# Patient Record
Sex: Male | Born: 1970 | Race: Black or African American | Hispanic: No | Marital: Married | Smoking: Current every day smoker
Health system: Southern US, Community
[De-identification: ages and names within clinical notes are randomized; demographics above are authoritative.]

---

## 2010-02-04 ENCOUNTER — Emergency Department (HOSPITAL_COMMUNITY): Admission: EM | Admit: 2010-02-04 | Discharge: 2010-02-05 | Payer: Self-pay | Admitting: Emergency Medicine

## 2010-12-26 LAB — CBC
Platelets: 216 10*3/uL (ref 150–400)
RDW: 14.1 % (ref 11.5–15.5)
WBC: 9 10*3/uL (ref 4.0–10.5)

## 2010-12-26 LAB — COMPREHENSIVE METABOLIC PANEL
AST: 25 U/L (ref 0–37)
BUN: 10 mg/dL (ref 6–23)
CO2: 31 mEq/L (ref 19–32)
Calcium: 10 mg/dL (ref 8.4–10.5)
Chloride: 104 mEq/L (ref 96–112)
Creatinine, Ser: 0.83 mg/dL (ref 0.4–1.5)
GFR calc non Af Amer: >60 mL/min (ref >60)
Glucose, Bld: 97 mg/dL (ref 70–99)
Potassium: 4.1 mEq/L (ref 3.5–5.1)
Total Bilirubin: 1.4 mg/dL — ABNORMAL HIGH (ref 0.3–1.2)
Total Protein: 8.2 g/dL (ref 6.0–8.3)

## 2010-12-26 LAB — TYPE AND SCREEN: Antibody Screen: NEGATIVE

## 2010-12-26 LAB — HEMOCCULT GUIAC POC 1CARD (OFFICE): Fecal Occult Bld: POSITIVE

## 2010-12-26 LAB — DIFFERENTIAL
Basophils Absolute: 0 10*3/uL (ref 0.0–0.1)
Lymphocytes Relative: 9 % — ABNORMAL LOW (ref 12–46)
Monocytes Absolute: 0.4 10*3/uL (ref 0.1–1.0)
Neutro Abs: 7.8 10*3/uL — ABNORMAL HIGH (ref 1.7–7.7)

## 2010-12-26 LAB — PROTIME-INR: Prothrombin Time: 13.3 seconds (ref 11.6–15.2)

## 2019-02-09 ENCOUNTER — Emergency Department (HOSPITAL_BASED_OUTPATIENT_CLINIC_OR_DEPARTMENT_OTHER): Payer: BLUE CROSS/BLUE SHIELD

## 2019-02-09 ENCOUNTER — Encounter (HOSPITAL_BASED_OUTPATIENT_CLINIC_OR_DEPARTMENT_OTHER): Payer: Self-pay

## 2019-02-09 ENCOUNTER — Other Ambulatory Visit: Payer: Self-pay

## 2019-02-09 ENCOUNTER — Emergency Department (HOSPITAL_BASED_OUTPATIENT_CLINIC_OR_DEPARTMENT_OTHER)
Admission: EM | Admit: 2019-02-09 | Discharge: 2019-02-09 | Disposition: A | Discharge status: Home / Self Care | Payer: BLUE CROSS/BLUE SHIELD | Attending: Emergency Medicine | Admitting: Emergency Medicine

## 2019-02-09 DIAGNOSIS — Y929 Unspecified place or not applicable: Secondary | ICD-10-CM | POA: Insufficient documentation

## 2019-02-09 DIAGNOSIS — S72124A Nondisplaced fracture of lesser trochanter of right femur, initial encounter for closed fracture: Secondary | ICD-10-CM | POA: Diagnosis not present

## 2019-02-09 DIAGNOSIS — Y999 Unspecified external cause status: Secondary | ICD-10-CM | POA: Diagnosis not present

## 2019-02-09 DIAGNOSIS — F17210 Nicotine dependence, cigarettes, uncomplicated: Secondary | ICD-10-CM | POA: Diagnosis not present

## 2019-02-09 DIAGNOSIS — X501XXA Overexertion from prolonged static or awkward postures, initial encounter: Secondary | ICD-10-CM | POA: Insufficient documentation

## 2019-02-09 DIAGNOSIS — Y9366 Activity, soccer: Secondary | ICD-10-CM | POA: Insufficient documentation

## 2019-02-09 DIAGNOSIS — S72121A Displaced fracture of lesser trochanter of right femur, initial encounter for closed fracture: Secondary | ICD-10-CM

## 2019-02-09 DIAGNOSIS — S8991XA Unspecified injury of right lower leg, initial encounter: Secondary | ICD-10-CM | POA: Diagnosis present

## 2019-02-09 MED ORDER — ACETAMINOPHEN 500 MG PO TABS
1000.0000 mg | ORAL_TABLET | Freq: Once | ORAL | Status: AC
  Administered 2019-02-09: 08:00:00 1000 mg via ORAL
  Filled 2019-02-09: qty 2

## 2019-02-09 MED ORDER — HYDROCODONE-ACETAMINOPHEN 5-325 MG PO TABS: 1.0000 | ORAL_TABLET | ORAL | 0 refills | Status: AC | PRN

## 2019-02-09 NOTE — ED Provider Notes (Signed)
[facility] EMERGENCY DEPARTMENT Provider Note   CSN: [id number] Arrival date & time: 02/09/19  0710    History   Chief Complaint Chief Complaint  Patient presents with  . Leg Injury    HPI Anthony FreestoneChristopher Archambault is a 48 y.o. male.     48yo M who p/w R hip pain. Yesterday around 6pm, he was playing soccer and accidentally stepped on the ball instead of kicking it, causing him to twist his R leg at the hip and then fall onto his right hip/buttock. He has had progressively worsening pain in his hip and thigh since then, worse w/ ambulation. He took ibuprofen around 5am without relief. No pain at knee or ankle. No previous injury to joint.   The history is provided by the patient.    History reviewed. No pertinent past medical history.  There are no active problems to display for this patient.   History reviewed. No pertinent surgical history.      Home Medications    Prior to Admission medications   Medication Sig Start Date End Date Taking? Authorizing Provider  HYDROcodone-acetaminophen (NORCO/VICODIN) 5-325 MG tablet Take 1 tablet by mouth every 4 (four) hours as needed for up to 5 days for severe pain. 02/09/19 02/14/19  Jasmin Trumbull, Ambrose Finlandachel Morgan, MD    Family History History reviewed. No pertinent family history.  Social History Social History   Tobacco Use  . Smoking status: Current Every Day Smoker    Types: Cigarettes  . Smokeless tobacco: Never Used  Substance Use Topics  . Alcohol use: Yes    Alcohol/week: 2.0 standard drinks    Types: 2 Cans of beer per week  . Drug use: Not Currently     Allergies   Patient has no known allergies.   Review of Systems Review of Systems  Constitutional: Negative for fever.  Musculoskeletal: Positive for arthralgias and gait problem.  Skin: Negative for wound.  Neurological: Negative for weakness and numbness.     Physical Exam Updated Vital Signs BP 133/82 (BP Location: Right Arm)   Pulse 88   Temp  98.4 F (36.9 C) (Oral)   Resp 16   Ht 5\' 7"  (1.702 m)   Wt 77.1 kg   SpO2 100%   BMI 26.63 kg/m   Physical Exam Vitals signs and nursing note reviewed.  Constitutional:      General: He is not in acute distress.    Appearance: He is well-developed.  HENT:     Head: Normocephalic and atraumatic.  Eyes:     Conjunctiva/sclera: Conjunctivae normal.  Neck:     Musculoskeletal: Neck supple.  Cardiovascular:     Pulses: Normal pulses.  Musculoskeletal: Normal range of motion.        General: No deformity.     Comments: Normal ROM and no tenderness at R ankle and knee; mild tenderness along R lateral quadriceps; full ROM R hip with pain during internal rotation  Skin:    General: Skin is warm and dry.  Neurological:     Mental Status: He is alert and oriented to person, place, and time.     Sensory: No sensory deficit.     Motor: No weakness.     Comments: Normal strength RLE  Psychiatric:        Judgment: Judgment normal.      ED Treatments / Results  Labs (all labs ordered are listed, but only abnormal results are displayed) Labs Reviewed - No data to display  EKG None  Radiology Dg Hip Unilat With Pelvis 2-3 Views Right  Result Date: 02/09/2019 CLINICAL DATA:  Soccer injury with acute right hip and thigh pain. Initial encounter. EXAM: DG HIP (WITH OR WITHOUT PELVIS) 2-3V RIGHT COMPARISON:  None. FINDINGS: There is a minimally displaced avulsion off of the tip of the lesser trochanter. No other evidence of fracture or dislocation. Mild proliferative disease involving both acetabula. The bony pelvis demonstrates no evidence of fracture, diastasis or lesion. IMPRESSION: Minimally displaced avulsive injury of the right lesser trochanter. Electronically Signed   By: Irish Lack M.D.   On: 02/09/2019 08:08   Dg Femur Min 2 Views Right  Result Date: 02/09/2019 CLINICAL DATA:  Soccer injury with acute right hip and thigh pain. Initial encounter. EXAM: RIGHT FEMUR 2 VIEWS  COMPARISON:  Right hip films on the same day. FINDINGS: As noted on the right hip films, there is an avulsive injury of the tip of the lesser trochanter which appears partial and minimally displaced. IMPRESSION: Partial avulsive injury off of the tip of the lesser trochanter. Electronically Signed   By: Irish Lack M.D.   On: 02/09/2019 08:12    Procedures Procedures (including critical care time)  Medications Ordered in ED Medications  acetaminophen (TYLENOL) tablet 1,000 mg (1,000 mg Oral Given 02/09/19 0733)     Initial Impression / Assessment and Plan / ED Course  I have reviewed the triage vital signs and the nursing notes.  Pertinent  imaging results that were available during my care of the patient were reviewed by me and considered in my medical decision making (see chart for details).        Neurovascularly intact distally.  Plain films show minimally displaced avulsion fracture of right lesser trochanter of femur.  Discussed with orthopedics, Dr. Lequita Halt, who recommends crutches, weight bearing as tolerated, and f/u in clinic in 1 week or sooner if worsening pain.  Reviewed supportive measures.  Pt voiced understanding of plan.  Final Clinical Impressions(s) / ED Diagnoses   Final diagnoses:  Closed avulsion fracture of lesser trochanter of femur, right, initial encounter [facility])    ED Discharge Orders         Ordered    HYDROcodone-acetaminophen (NORCO/VICODIN) 5-325 MG tablet  Every 4 hours PRN     02/09/19 0850           Nalla Purdy, Ambrose Finland, MD 02/09/19 [phone]

## 2019-02-09 NOTE — ED Triage Notes (Signed)
Pt playing soccer yesterday, 6pm went to kick the ball and right foot landed awkwardly on the ball, pt fell, causing pain right hip and thigh.  Taking ibuprofen with little improvement.  Last taken at 0500.

## 2020-09-10 IMAGING — DX RIGHT FEMUR 2 VIEWS
4 series · 4 of 4 positions shown · non-contrast
Comparison: Right hip films on the same day.

CLINICAL DATA: Soccer injury with acute right hip and thigh pain.
Initial encounter.

EXAM:
RIGHT FEMUR 2 VIEWS

[femur ap (1 of 2)]
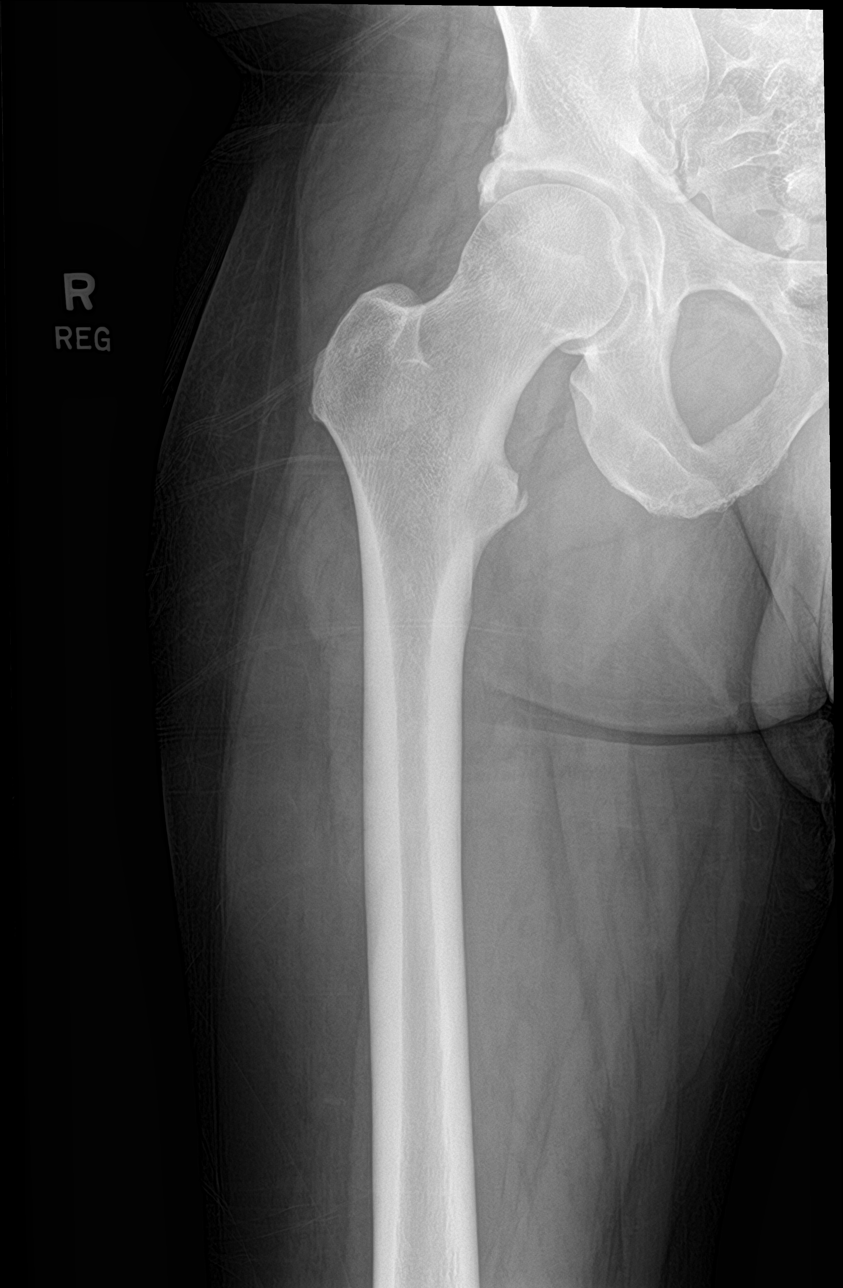

[femur ap (2 of 2)]
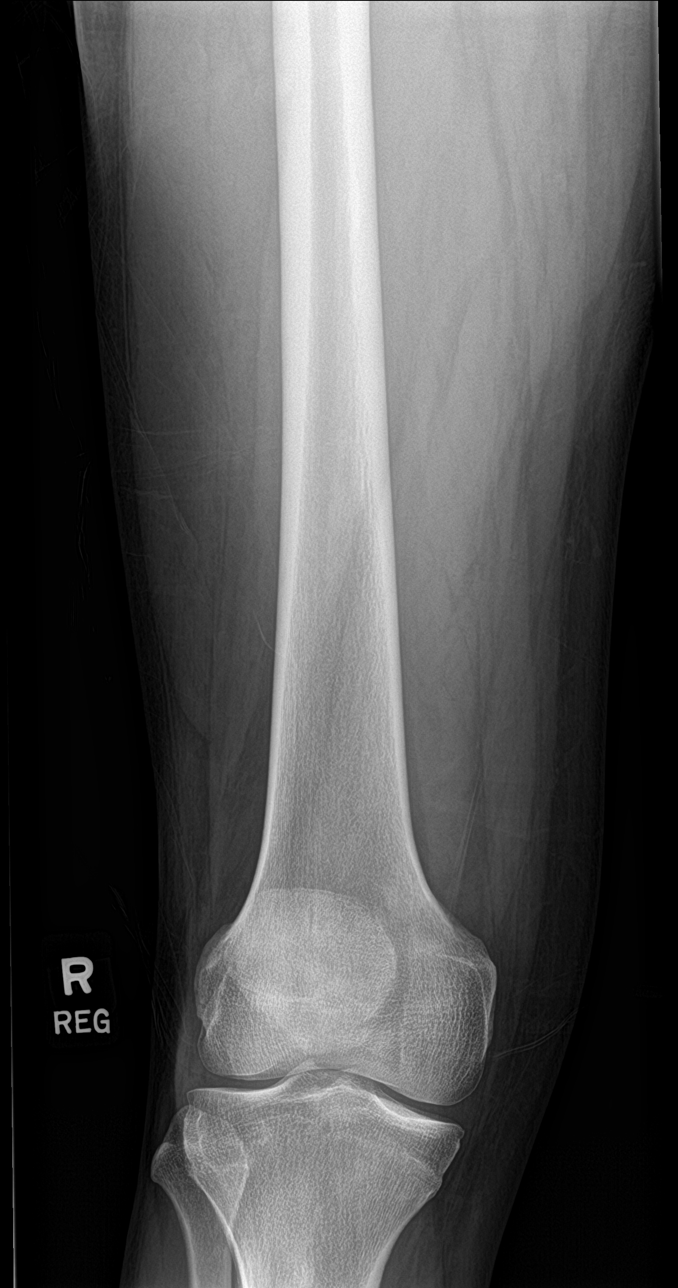

[femur lat (1 of 2)]
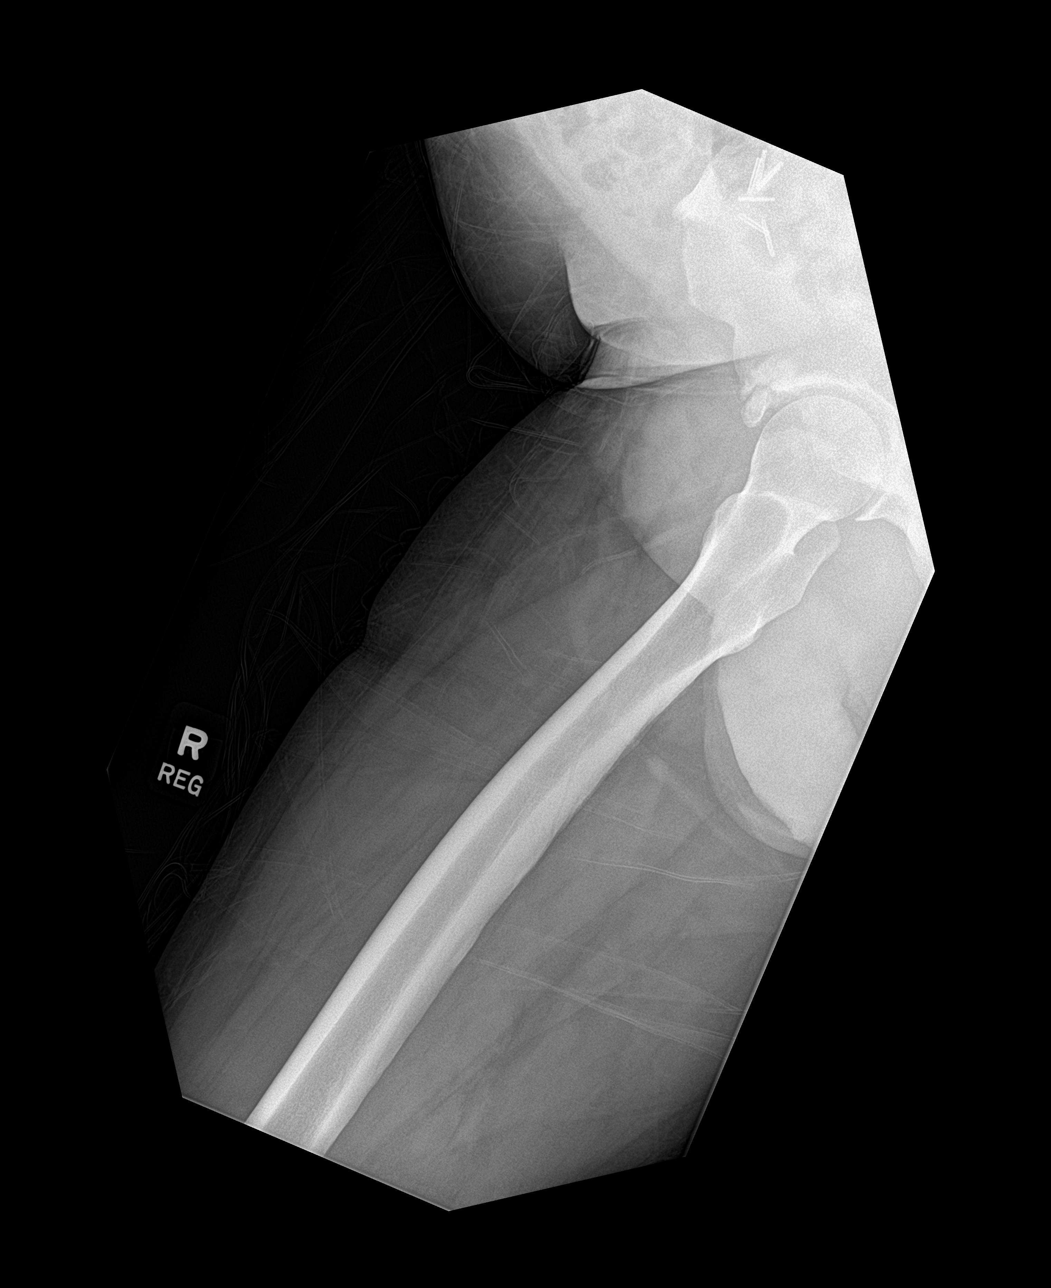

[femur lat (2 of 2)]
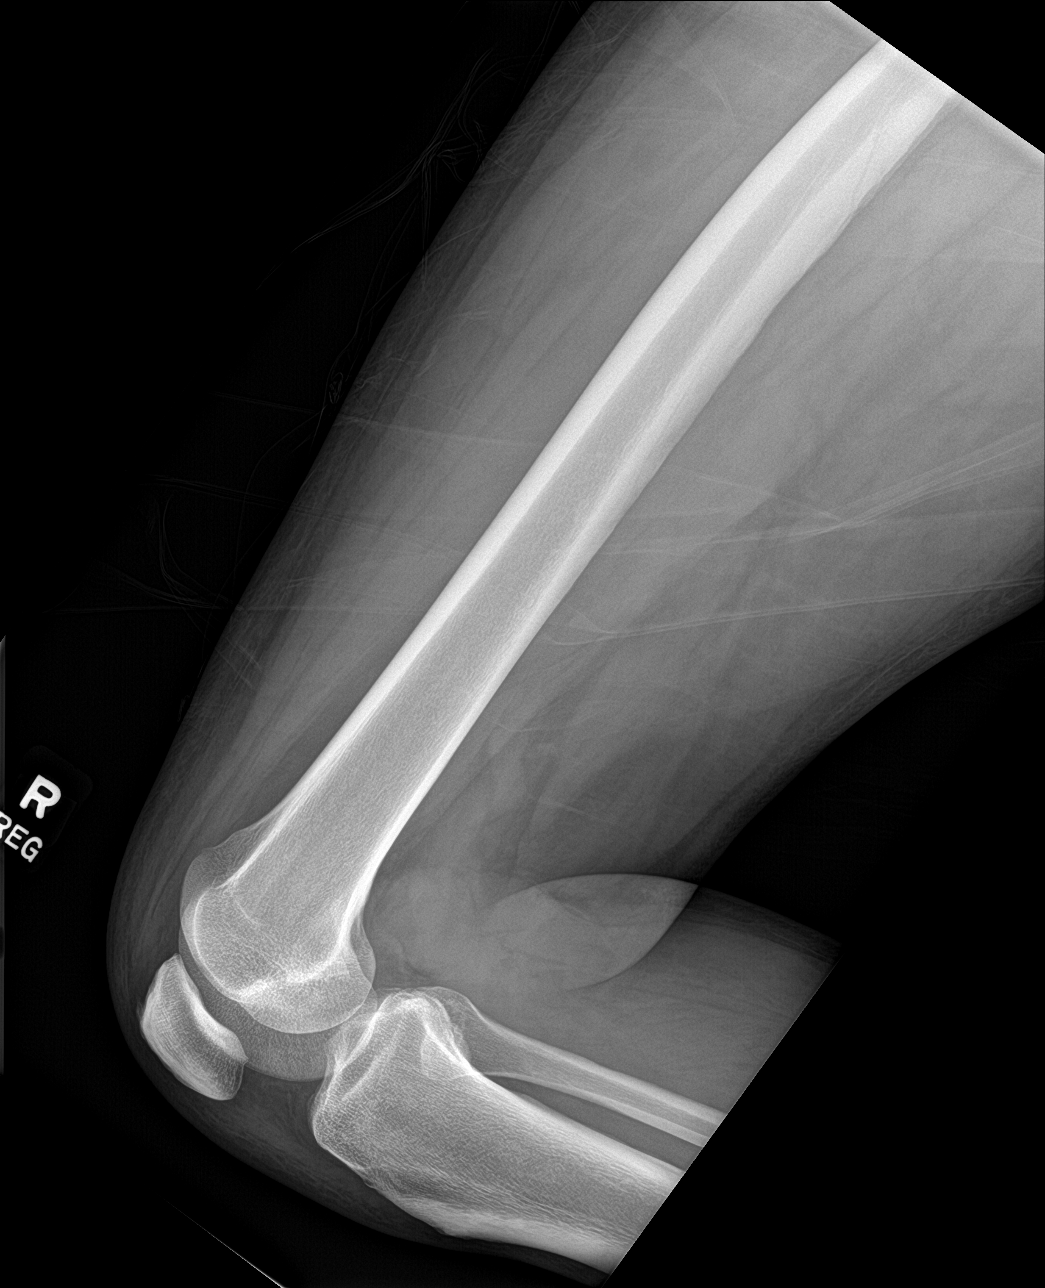

[4 of 4 positions shown; findings below may reference images not displayed]

FINDINGS: As noted on the right hip films, there is an avulsive injury of the
tip of the lesser trochanter which appears partial and minimally
displaced.
IMPRESSION: Partial avulsive injury off of the tip of the lesser trochanter.
